# Patient Record
Sex: Female | Born: 1995 | Race: Black or African American | Hispanic: No | Marital: Single
Health system: Southern US, Community
[De-identification: ages and names within clinical notes are randomized; demographics above are authoritative.]

---
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
## 2019-03-22 ENCOUNTER — Other Ambulatory Visit: Payer: Self-pay

## 2019-03-22 ENCOUNTER — Encounter: Payer: Self-pay | Admitting: Intensive Care

## 2019-03-22 ENCOUNTER — Emergency Department
Admission: EM | Admit: 2019-03-22 | Discharge: 2019-03-22 | Disposition: A | Discharge status: Home / Self Care | Payer: No Typology Code available for payment source | Attending: Emergency Medicine | Admitting: Emergency Medicine

## 2019-03-22 ENCOUNTER — Emergency Department: Payer: No Typology Code available for payment source

## 2019-03-22 DIAGNOSIS — S63502A Unspecified sprain of left wrist, initial encounter: Secondary | ICD-10-CM | POA: Diagnosis not present

## 2019-03-22 DIAGNOSIS — Z7722 Contact with and (suspected) exposure to environmental tobacco smoke (acute) (chronic): Secondary | ICD-10-CM | POA: Insufficient documentation

## 2019-03-22 DIAGNOSIS — Y999 Unspecified external cause status: Secondary | ICD-10-CM | POA: Insufficient documentation

## 2019-03-22 DIAGNOSIS — Y92410 Unspecified street and highway as the place of occurrence of the external cause: Secondary | ICD-10-CM | POA: Insufficient documentation

## 2019-03-22 DIAGNOSIS — S66912A Strain of unspecified muscle, fascia and tendon at wrist and hand level, left hand, initial encounter: Secondary | ICD-10-CM

## 2019-03-22 DIAGNOSIS — M25512 Pain in left shoulder: Secondary | ICD-10-CM | POA: Diagnosis present

## 2019-03-22 DIAGNOSIS — M7918 Myalgia, other site: Secondary | ICD-10-CM

## 2019-03-22 DIAGNOSIS — J45909 Unspecified asthma, uncomplicated: Secondary | ICD-10-CM | POA: Diagnosis not present

## 2019-03-22 DIAGNOSIS — Y93I9 Activity, other involving external motion: Secondary | ICD-10-CM | POA: Insufficient documentation

## 2019-03-22 MED ORDER — TRAMADOL HCL 50 MG PO TABS
50.0000 mg | ORAL_TABLET | Freq: Once | ORAL | Status: AC
  Administered 2019-03-22: 50 mg via ORAL
  Filled 2019-03-22: qty 1

## 2019-03-22 MED ORDER — CYCLOBENZAPRINE HCL 10 MG PO TABS: 10.0000 mg | ORAL_TABLET | Freq: Three times a day (TID) | ORAL | 0 refills | Status: AC | PRN

## 2019-03-22 MED ORDER — CYCLOBENZAPRINE HCL 10 MG PO TABS
10.0000 mg | ORAL_TABLET | Freq: Once | ORAL | Status: AC
  Administered 2019-03-22: 10 mg via ORAL
  Filled 2019-03-22: qty 1

## 2019-03-22 MED ORDER — TRAMADOL HCL 50 MG PO TABS: 50.0000 mg | ORAL_TABLET | Freq: Four times a day (QID) | ORAL | 0 refills | Status: AC | PRN
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
## 2019-03-22 NOTE — ED Notes (Signed)
Patient transported to X-ray 

## 2019-03-22 NOTE — Discharge Instructions (Addendum)
Your x-ray of the left shoulder was unremarkable.  Follow discharge care instructions.  Wear wrist splint for 3 to 5 days while awake as needed.  Take medication as directed.  Take tramadol and Flexeril as directed.  Due to your gastrointestinal condition advised over-the-counter 650 mg of Tylenol along with your prescription medications.
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
